# Patient Record
Sex: Male | Born: 2005 | Race: White | Hispanic: No | Marital: Single | Smoking: Never smoker
Health system: Southern US, Community
[De-identification: ages and names within clinical notes are randomized; demographics above are authoritative.]

## PROBLEM LIST (undated history)

## (undated) DIAGNOSIS — J45909 Unspecified asthma, uncomplicated: Secondary | ICD-10-CM

## (undated) DIAGNOSIS — T7840XA Allergy, unspecified, initial encounter: Secondary | ICD-10-CM

## (undated) HISTORY — DX: Allergy, unspecified, initial encounter: T78.40XA

## (undated) HISTORY — PX: WRIST FRACTURE SURGERY: SHX121

## (undated) HISTORY — DX: Unspecified asthma, uncomplicated: J45.909

---
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
## 2023-02-13 ENCOUNTER — Telehealth: Payer: Self-pay

## 2023-02-13 NOTE — Telephone Encounter (Signed)
Pt's mom called in stating that pt was seen in e/r for a cut that he received 5 stitches for. Pt's mom wants to know if pt should get a Tetanus booster shot? Please advise.  Cb#: [phone]

## 2023-02-22 ENCOUNTER — Encounter: Payer: Self-pay | Admitting: Family Medicine

## 2023-02-22 ENCOUNTER — Ambulatory Visit (INDEPENDENT_AMBULATORY_CARE_PROVIDER_SITE_OTHER): Payer: 59 | Admitting: Family Medicine

## 2023-02-22 VITALS — BP 120/72 | HR 71 | Temp 98.3°F | Ht 72.7 in | Wt 196.2 lb

## 2023-02-22 DIAGNOSIS — J45909 Unspecified asthma, uncomplicated: Secondary | ICD-10-CM | POA: Insufficient documentation

## 2023-02-22 DIAGNOSIS — Z4802 Encounter for removal of sutures: Secondary | ICD-10-CM | POA: Diagnosis not present

## 2023-02-22 NOTE — Assessment & Plan Note (Signed)
Symptoms consistent with allergic rhinitis. Encouraged Flonase PRN and daily Cetrizine. Return to office for fever, mucopurulent drainage or sinus pressure and pain.

## 2023-02-22 NOTE — Assessment & Plan Note (Signed)
Laceration well approximated and healing nicely. No redness, warmth, or drainage. 11 days s/p injury. Sutures removed without complications. Return to office for redness, warmth, or drainage.

## 2023-02-22 NOTE — Progress Notes (Signed)
Subjective:  HPI: Cameron Horton is a 17 y.o. male presenting on 02/22/2023 for Sinus Problem (Pt c/o nasal congestion and pressure. ) and Wound Check (Would like stitches removed from L hand. )   HPI Patient is in today for suture removal. He has a cut on his left hand that was repaired 11 days ago. It is healing well with no drainage or redness.  He also reports a month of nasal congestion. No purulent drainage, fever, chills, body aches, sinus pressure.   Suture Removal  Date/Time: 02/22/2023 1:06 PM  Performed by: Park Meo, FNP Authorized by: Kurtis Bushman S, FNP  Body area: upper extremity Location details: left hand Wound Appearance: clean Post-removal: antibiotic ointment applied Patient tolerance: patient tolerated the procedure well with no immediate complications      Review of Systems  All other systems reviewed and are negative.   Relevant past medical history reviewed and updated as indicated.   Past Medical History:  Diagnosis Date   Allergy    seasonal   Asthma      Past Surgical History:  Procedure Laterality Date   WRIST FRACTURE SURGERY     cast (LEFT)    Allergies and medications reviewed and updated.   Current Outpatient Medications:    albuterol (VENTOLIN HFA) 108 (90 Base) MCG/ACT inhaler, Inhale 2 puffs into the lungs every 6 (six) hours as needed for wheezing or shortness of breath., Disp: 8 g, Rfl: 0   cephALEXin (KEFLEX) 500 MG capsule, Take 500 mg by mouth every 6 (six) hours., Disp: , Rfl:    EPIPEN 2-PAK 0.3 MG/0.3ML SOAJ injection, Inject INTRAMUSCULARLY AS NEEDED for anaphylaxis., Disp: 2 each, Rfl: 0   fluticasone (FLOVENT HFA) 110 MCG/ACT inhaler, Inhale 2 puffs into the lungs 2 (two) times daily., Disp: 1 Inhaler, Rfl: 12   predniSONE (DELTASONE) 20 MG tablet, 3 tabs poqday 1-2, 2 tabs poqday 3-4, 1 tab poqday 5-6, Disp: 12 tablet, Rfl: 0  Allergies  Allergen Reactions   Flounder [Fish Allergy] Hives, Shortness Of  Breath and Swelling   Other Hives, Shortness Of Breath and Swelling    SALT-WATER FISH ALLERGY   Shellfish Allergy Swelling, Hives, Rash, Itching and Shortness Of Breath   Citrullus Vulgaris Hives   Penicillin G Hives and Swelling   Penicillins Rash, Hives and Swelling    Objective:   BP 120/72   Pulse 71   Temp 98.3 F (36.8 C)   Ht 6' 0.7" (1.847 m)   Wt 196 lb 3.2 oz (89 kg)   SpO2 99%   BMI 26.10 kg/m      02/22/2023   11:51 AM 08/01/2022    2:32 PM 05/12/2022    9:57 AM  Vitals with BMI  Height 6' 0.7" 6\' 0"    Weight 196 lbs 3 oz 193 lbs   BMI 26.09 26.17   Systolic 120 120 161  Diastolic 72 68 78  Pulse 71 62 54     Physical Exam Vitals and nursing note reviewed.  Constitutional:      Appearance: Normal appearance. He is normal weight.  HENT:     Head: Normocephalic and atraumatic.     Nose: Congestion present.  Eyes:     Conjunctiva/sclera: Conjunctivae normal.  Skin:    General: Skin is warm and dry.     Capillary Refill: Capillary refill takes less than 2 seconds.     Findings: Laceration present.       Neurological:  General: No focal deficit present.     Mental Status: He is alert and oriented to person, place, and time. Mental status is at baseline.  Psychiatric:        Mood and Affect: Mood normal.        Behavior: Behavior normal.        Thought Content: Thought content normal.        Judgment: Judgment normal.     Assessment & Plan:  Encounter for removal of sutures Assessment & Plan: Laceration well approximated and healing nicely. No redness, warmth, or drainage. 11 days s/p injury. Sutures removed without complications. Return to office for redness, warmth, or drainage.   Asthma due to seasonal allergies Assessment & Plan: Symptoms consistent with allergic rhinitis. Encouraged Flonase PRN and daily Cetrizine. Return to office for fever, mucopurulent drainage or sinus pressure and pain.   Other orders -     Suture Removal      Follow up plan: Return if symptoms worsen or fail to improve.  Park Meo, FNP

## 2023-03-21 ENCOUNTER — Other Ambulatory Visit: Payer: 59

## 2023-03-21 DIAGNOSIS — Z0000 Encounter for general adult medical examination without abnormal findings: Secondary | ICD-10-CM

## 2023-03-21 NOTE — Addendum Note (Signed)
Addended by: Arta Silence on: 03/21/2023 04:11 PM   Modules accepted: Orders

## 2023-03-22 LAB — LIPID PANEL
Cholesterol: 191 mg/dL — ABNORMAL HIGH (ref <170)
HDL: 45 mg/dL — ABNORMAL LOW (ref >45)
LDL Cholesterol (Calc): 130 mg/dL (calc) — ABNORMAL HIGH (ref <110)
Non-HDL Cholesterol (Calc): 146 mg/dL (calc) — ABNORMAL HIGH (ref <120)
Total CHOL/HDL Ratio: 4.2 (calc) (ref <5.0)
Triglycerides: 65 mg/dL (ref <90)

## 2023-03-22 LAB — COMPLETE METABOLIC PANEL WITH GFR
AG Ratio: 1.9 (calc) (ref 1.0–2.5)
ALT: 18 U/L (ref 8–46)
AST: 19 U/L (ref 12–32)
Albumin: 4.7 g/dL (ref 3.6–5.1)
Alkaline phosphatase (APISO): 44 U/L — ABNORMAL LOW (ref 46–169)
BUN: 15 mg/dL (ref 7–20)
CO2: 28 mmol/L (ref 20–32)
Calcium: 9.7 mg/dL (ref 8.9–10.4)
Chloride: 104 mmol/L (ref 98–110)
Creat: 1.12 mg/dL (ref 0.60–1.24)
Globulin: 2.5 g/dL (calc) (ref 2.1–3.5)
Glucose, Bld: 84 mg/dL (ref 65–99)
Potassium: 4.3 mmol/L (ref 3.8–5.1)
Sodium: 139 mmol/L (ref 135–146)
Total Bilirubin: 0.8 mg/dL (ref 0.2–1.1)
Total Protein: 7.2 g/dL (ref 6.3–8.2)
eGFR: 97 mL/min/{1.73_m2} (ref >=60)

## 2023-03-22 LAB — CBC WITH DIFFERENTIAL/PLATELET
Absolute Monocytes: 360 cells/uL (ref 200–950)
Basophils Absolute: 82 cells/uL (ref 0–200)
Basophils Relative: 1.7 %
Eosinophils Absolute: 470 cells/uL (ref 15–500)
Eosinophils Relative: 9.8 %
HCT: 43.7 % (ref 38.5–50.0)
Hemoglobin: 14.5 g/dL (ref 13.2–17.1)
Lymphs Abs: 1656 cells/uL (ref 850–3900)
MCH: 29.8 pg (ref 27.0–33.0)
MCHC: 33.2 g/dL (ref 32.0–36.0)
MCV: 89.7 fL (ref 80.0–100.0)
MPV: 9.9 fL (ref 7.5–12.5)
Monocytes Relative: 7.5 %
Neutro Abs: 2232 cells/uL (ref 1500–7800)
Neutrophils Relative %: 46.5 %
Platelets: 221 10*3/uL (ref 140–400)
RBC: 4.87 10*6/uL (ref 4.20–5.80)
RDW: 12.2 % (ref 11.0–15.0)
Total Lymphocyte: 34.5 %
WBC: 4.8 10*3/uL (ref 3.8–10.8)

## 2023-03-26 ENCOUNTER — Encounter: Payer: 59 | Admitting: Family Medicine

## 2023-04-05 ENCOUNTER — Ambulatory Visit (INDEPENDENT_AMBULATORY_CARE_PROVIDER_SITE_OTHER): Payer: 59 | Admitting: Family Medicine

## 2023-04-05 ENCOUNTER — Encounter: Payer: Self-pay | Admitting: Family Medicine

## 2023-04-05 VITALS — BP 118/64 | HR 77 | Temp 98.2°F | Ht 72.71 in | Wt 193.0 lb

## 2023-04-05 DIAGNOSIS — Z0000 Encounter for general adult medical examination without abnormal findings: Secondary | ICD-10-CM

## 2023-04-05 NOTE — Progress Notes (Signed)
Subjective:    Patient ID: Cameron Horton, male    DOB: [birth year], 17 y.o.   MRN: [id number]  HPI Patient is an 17 year old Caucasian gentleman here today for a physical exam.  Patient did well in college last year.  He made the Dean's list.  He denies any tobacco use.  He denies any vaping.  Recommended against any drug use.  He is training to become an RA.  He is dating.  He is in a stable monogamous relationship.  Overall he is doing well.  He denies any anxiety or depression.  He exercises regularly and is at a healthy weight.  His most recent lab work is listed below.  He admits to eating a lot of fried chicken and drinking a lot of sodas. Lab on 03/21/2023  Component Date Value Ref Range Status   Cholesterol 03/21/2023 191 (H)  <170 mg/dL Final   HDL 73/22/0254 45 (L)  >45 mg/dL Final   Triglycerides 27/02/2375 65  <90 mg/dL Final   LDL Cholesterol (Calc) 03/21/2023 130 (H)  <110 mg/dL (calc) Final   Comment: LDL-C is now calculated using the Martin-Hopkins  calculation, which is a validated novel method providing  better accuracy than the Friedewald equation in the  estimation of LDL-C.  Horald Pollen et al. Lenox Ahr. 2831;517(61): 2061-2068  (http://education.[facility].com/faq/FAQ164)    Total CHOL/HDL Ratio 03/21/2023 4.2  <6.0 (calc) Final   Non-HDL Cholesterol (Calc) 03/21/2023 146 (H)  <120 mg/dL (calc) Final   Comment: For patients with diabetes plus 1 major ASCVD risk  factor, treating to a non-HDL-C goal of <100 mg/dL  (LDL-C of <73 mg/dL) is considered a therapeutic  option.    Glucose, Bld 03/21/2023 84  65 - 99 mg/dL Final   Comment: .            Fasting reference interval .    BUN 03/21/2023 15  7 - 20 mg/dL Final   Creat 71/02/2693 1.12  0.60 - 1.24 mg/dL Final   eGFR 85/46/2703 97  > OR = 60 mL/min/1.26m2 Final   BUN/Creatinine Ratio 03/21/2023 SEE NOTE:  6 - 22 (calc) Final   Comment:    Not Reported: BUN and Creatinine are within    reference range. .     Sodium 03/21/2023 139  135 - 146 mmol/L Final   Potassium 03/21/2023 4.3  3.8 - 5.1 mmol/L Final   Chloride 03/21/2023 104  98 - 110 mmol/L Final   CO2 03/21/2023 28  20 - 32 mmol/L Final   Calcium 03/21/2023 9.7  8.9 - 10.4 mg/dL Final   Total Protein 50/05/3817 7.2  6.3 - 8.2 g/dL Final   Albumin 29/93/7169 4.7  3.6 - 5.1 g/dL Final   Globulin 67/89/3810 2.5  2.1 - 3.5 g/dL (calc) Final   AG Ratio 03/21/2023 1.9  1.0 - 2.5 (calc) Final   Total Bilirubin 03/21/2023 0.8  0.2 - 1.1 mg/dL Final   Alkaline phosphatase (APISO) 03/21/2023 44 (L)  46 - 169 U/L Final   AST 03/21/2023 19  12 - 32 U/L Final   ALT 03/21/2023 18  8 - 46 U/L Final   WBC 03/21/2023 4.8  3.8 - 10.8 Thousand/uL Final   RBC 03/21/2023 4.87  4.20 - 5.80 Million/uL Final   Hemoglobin 03/21/2023 14.5  13.2 - 17.1 g/dL Final   HCT 17/51/0258 43.7  38.5 - 50.0 % Final   MCV 03/21/2023 89.7  80.0 - 100.0 fL Final   MCH 03/21/2023 29.8  27.0 -  33.0 pg Final   MCHC 03/21/2023 33.2  32.0 - 36.0 g/dL Final   RDW 08/65/7846 12.2  11.0 - 15.0 % Final   Platelets 03/21/2023 221  140 - 400 Thousand/uL Final   MPV 03/21/2023 9.9  7.5 - 12.5 fL Final   Neutro Abs 03/21/2023 2,232  1,500 - 7,800 cells/uL Final   Lymphs Abs 03/21/2023 1,656  850 - 3,900 cells/uL Final   Absolute Monocytes 03/21/2023 360  200 - 950 cells/uL Final   Eosinophils Absolute 03/21/2023 470  15 - 500 cells/uL Final   Basophils Absolute 03/21/2023 82  0 - 200 cells/uL Final   Neutrophils Relative % 03/21/2023 46.5  % Final   Total Lymphocyte 03/21/2023 34.5  % Final   Monocytes Relative 03/21/2023 7.5  % Final   Eosinophils Relative 03/21/2023 9.8  % Final   Basophils Relative 03/21/2023 1.7  % Final    Past Medical History:  Diagnosis Date   Allergy    seasonal   Asthma    Past Surgical History:  Procedure Laterality Date   WRIST FRACTURE SURGERY     cast (LEFT)   Current Outpatient Medications on File Prior to Visit  Medication Sig Dispense  Refill   albuterol (VENTOLIN HFA) 108 (90 Base) MCG/ACT inhaler Inhale 2 puffs into the lungs every 6 (six) hours as needed for wheezing or shortness of breath. 8 g 0   cephALEXin (KEFLEX) 500 MG capsule Take 500 mg by mouth every 6 (six) hours.     EPIPEN 2-PAK 0.3 MG/0.3ML SOAJ injection Inject INTRAMUSCULARLY AS NEEDED for anaphylaxis. 2 each 0   fluticasone (FLOVENT HFA) 110 MCG/ACT inhaler Inhale 2 puffs into the lungs 2 (two) times daily. 1 Inhaler 12   No current facility-administered medications on file prior to visit.   Allergies  Allergen Reactions   Flounder [Fish Allergy] Hives, Shortness Of Breath and Swelling   Other Hives, Shortness Of Breath and Swelling    SALT-WATER FISH ALLERGY   Shellfish Allergy Swelling, Hives, Rash, Itching and Shortness Of Breath   Citrullus Vulgaris Hives   Penicillin G Hives and Swelling   Penicillins Rash, Hives and Swelling   Social History   Socioeconomic History   Marital status: Single    Spouse name: Not on file   Number of children: Not on file   Years of education: Not on file   Highest education level: Not on file  Occupational History   Not on file  Tobacco Use   Smoking status: Never   Smokeless tobacco: Never  Substance and Sexual Activity   Alcohol use: No   Drug use: No   Sexual activity: Never    Comment: 5th grade [employer]  Other Topics Concern   Not on file  Social History Narrative   Not on file   Social Determinants of Health   Financial Resource Strain: Not on file  Food Insecurity: Not on file  Transportation Needs: Not on file  Physical Activity: Not on file  Stress: Not on file  Social Connections: Not on file  Intimate Partner Violence: Not on file   Family History  Problem Relation Age of Onset   Asthma Mother    Asthma Brother    Hyperlipidemia Maternal Grandmother    Hypertension Maternal Grandmother    COPD Maternal Grandfather    Diabetes Maternal Grandfather    Stroke Paternal  Grandfather    Hypertension Father      Review of Systems     Objective:  Physical Exam Vitals reviewed.  Constitutional:      General: He is not in acute distress.    Appearance: Normal appearance. He is normal weight. He is not ill-appearing, toxic-appearing or diaphoretic.  HENT:     Head: Normocephalic and atraumatic.     Right Ear: Tympanic membrane and ear canal normal. There is no impacted cerumen.     Left Ear: Tympanic membrane and ear canal normal. There is no impacted cerumen.     Nose: Nose normal. No congestion or rhinorrhea.     Mouth/Throat:     Mouth: Mucous membranes are moist.     Pharynx: Oropharynx is clear. No oropharyngeal exudate.  Eyes:     General: No scleral icterus.       Right eye: No discharge.        Left eye: No discharge.     Extraocular Movements: Extraocular movements intact.     Conjunctiva/sclera: Conjunctivae normal.     Pupils: Pupils are equal, round, and reactive to light.  Neck:     Vascular: No carotid bruit.  Cardiovascular:     Rate and Rhythm: Normal rate and regular rhythm.     Pulses: Normal pulses.     Heart sounds: Normal heart sounds. No murmur heard.    No friction rub. No gallop.  Pulmonary:     Effort: Pulmonary effort is normal. No respiratory distress.     Breath sounds: Normal breath sounds. No stridor. No wheezing, rhonchi or rales.  Chest:     Chest wall: No tenderness.  Abdominal:     General: Abdomen is flat. Bowel sounds are normal. There is no distension.     Palpations: Abdomen is soft. There is no mass.     Tenderness: There is no abdominal tenderness. There is no right CVA tenderness, left CVA tenderness, guarding or rebound.     Hernia: No hernia is present.  Musculoskeletal:        General: No swelling, tenderness, deformity or signs of injury. Normal range of motion.     Cervical back: Normal range of motion and neck supple. No rigidity or tenderness.     Right lower leg: No edema.     Left lower  leg: No edema.  Lymphadenopathy:     Cervical: No cervical adenopathy.  Skin:    General: Skin is warm.     Coloration: Skin is not jaundiced or pale.     Findings: No bruising, erythema, lesion or rash.  Neurological:     General: No focal deficit present.     Mental Status: He is alert and oriented to person, place, and time. Mental status is at baseline.     Cranial Nerves: No cranial nerve deficit.     Sensory: No sensory deficit.     Motor: No weakness.     Coordination: Coordination normal.     Gait: Gait normal.     Deep Tendon Reflexes: Reflexes normal.  Psychiatric:        Mood and Affect: Mood normal.        Behavior: Behavior normal.        Thought Content: Thought content normal.        Judgment: Judgment normal.           Assessment & Plan:  General medical exam  Physical exam today is completely normal.  Immunizations are up-to-date.  Patient has an athletic build and is in good physical shape.  Recommended against any drug use.  Recommended  against vaping and tobacco use.  Patient is in a stable monogamous relationship.  Advised safe sex practices to avoid unintended pregnancies.  Lab work shows an elevated cholesterol.  Recommended a diet rich in fruits and vegetables.  Recommended reduction in sodas and fried foods.  Recommended more grilled and baked chicken and fish and [place] instead of beef and pork

## 2023-10-16 ENCOUNTER — Encounter: Payer: Self-pay | Admitting: Family Medicine

## 2023-10-16 ENCOUNTER — Ambulatory Visit (INDEPENDENT_AMBULATORY_CARE_PROVIDER_SITE_OTHER): Payer: 59 | Admitting: Family Medicine

## 2023-10-16 VITALS — BP 110/60 | HR 104 | Temp 99.4°F | Resp 97 | Ht 72.76 in | Wt 191.6 lb

## 2023-10-16 DIAGNOSIS — J029 Acute pharyngitis, unspecified: Secondary | ICD-10-CM | POA: Diagnosis not present

## 2023-10-16 DIAGNOSIS — J069 Acute upper respiratory infection, unspecified: Secondary | ICD-10-CM | POA: Diagnosis not present

## 2023-10-16 LAB — INFLUENZA A AND B AG, IMMUNOASSAY
INFLUENZA A ANTIGEN: DETECTED — AB
INFLUENZA B ANTIGEN: NOT DETECTED

## 2023-10-16 MED ORDER — OSELTAMIVIR PHOSPHATE 75 MG PO CAPS: 75.0000 mg | ORAL_CAPSULE | Freq: Two times a day (BID) | ORAL | 0 refills | Status: AC

## 2023-10-16 NOTE — Progress Notes (Signed)
Subjective:    Patient ID: Cameron Horton, male    DOB: [birth year], 18 y.o.   MRN: [id number] Patient is a 18 year old Caucasian gentleman who reports a 3 to 4-day history of sore scratchy throat, cough, body aches, and low-grade fever.  He denies any chest pain.  Denies shortness breath.  He denies any dyspnea on exertion.  He denies any nausea or vomiting or abdominal pain.  Strep test today is negative. Past Medical History:  Diagnosis Date   Allergy    seasonal   Asthma    Past Surgical History:  Procedure Laterality Date   WRIST FRACTURE SURGERY     cast (LEFT)   Current Outpatient Medications on File Prior to Visit  Medication Sig Dispense Refill   albuterol (VENTOLIN HFA) 108 (90 Base) MCG/ACT inhaler Inhale 2 puffs into the lungs every 6 (six) hours as needed for wheezing or shortness of breath. 8 g 0   EPIPEN 2-PAK 0.3 MG/0.3ML SOAJ injection Inject INTRAMUSCULARLY AS NEEDED for anaphylaxis. 2 each 0   fluticasone (FLOVENT HFA) 110 MCG/ACT inhaler Inhale 2 puffs into the lungs 2 (two) times daily. 1 Inhaler 12   No current facility-administered medications on file prior to visit.   Allergies  Allergen Reactions   Flounder [Fish Allergy] Hives, Shortness Of Breath and Swelling   Other Hives, Shortness Of Breath and Swelling    SALT-WATER FISH ALLERGY   Shellfish Allergy Swelling, Hives, Rash, Itching and Shortness Of Breath   Citrullus Vulgaris Hives   Penicillin G Hives and Swelling   Penicillins Rash, Hives and Swelling   Social History   Socioeconomic History   Marital status: Single    Spouse name: Not on file   Number of children: Not on file   Years of education: Not on file   Highest education level: Not on file  Occupational History   Not on file  Tobacco Use   Smoking status: Never   Smokeless tobacco: Never  Substance and Sexual Activity   Alcohol use: No   Drug use: No   Sexual activity: Never    Comment: 5th grade [employer]  Other Topics  Concern   Not on file  Social History Narrative   Not on file   Social Drivers of Health   Financial Resource Strain: Not on file  Food Insecurity: Not on file  Transportation Needs: Not on file  Physical Activity: Not on file  Stress: Not on file  Social Connections: Not on file  Intimate Partner Violence: Not on file     Review of Systems  All other systems reviewed and are negative.      Objective:   Physical Exam Vitals reviewed.  Constitutional:      Appearance: He is well-developed. He is not ill-appearing or toxic-appearing.  HENT:     Right Ear: Tympanic membrane and ear canal normal. Tympanic membrane is not erythematous.     Left Ear: Tympanic membrane and ear canal normal. Tympanic membrane is not erythematous.     Nose: No congestion or rhinorrhea.     Mouth/Throat:     Pharynx: Oropharynx is clear. No oropharyngeal exudate.  Eyes:     Conjunctiva/sclera: Conjunctivae normal.  Cardiovascular:     Rate and Rhythm: Normal rate and regular rhythm.     Heart sounds: Normal heart sounds. No murmur heard. Pulmonary:     Effort: Pulmonary effort is normal. No respiratory distress.     Breath sounds: Normal breath sounds. No stridor,  decreased air movement or transmitted upper airway sounds. No wheezing, rhonchi or rales.  Chest:     Chest wall: No tenderness.  Abdominal:     General: Bowel sounds are normal.     Palpations: Abdomen is soft. There is no mass.  Lymphadenopathy:     Cervical: No cervical adenopathy.  Neurological:     Mental Status: He is alert.           Assessment & Plan:  Sorethroat - Plan: STREP GROUP A AG, W/REFLEX TO CULT  Viral upper respiratory tract infection Strep test is negative, I believe the patient has a viral upper respiratory infection.  Symptoms sound similar to the flu.  Therefore I will check the patient for influenza.  Recommend supportive care including Tylenol and/or Profen for fever body aches, rest, pushing  fluids.  Anticipate gradual improvement over the next 3 to 4 days.  Patient tested positive for influenza.  Take Tamiflu 75 mg twice daily for 5 days.

## 2023-10-16 NOTE — Addendum Note (Signed)
Addended by: Darral Dash on: 10/16/2023 03:53 PM   Modules accepted: Orders

## 2023-10-18 LAB — CULTURE, GROUP A STREP
Micro Number: 16007547
SPECIMEN QUALITY:: ADEQUATE

## 2023-10-18 LAB — STREP GROUP A AG, W/REFLEX TO CULT: Streptococcus Group A AG: NOT DETECTED

## 2024-04-01 ENCOUNTER — Telehealth: Payer: Self-pay

## 2024-04-01 NOTE — Telephone Encounter (Signed)
 Copied from CRM [id number]. Topic: Appointments - Scheduling Inquiry for Clinic >> Apr 01, 2024  1:31 PM Fonda T wrote: Reason for CRM: Patient/patient representative is calling to schedule an appointment. Refer to attachments for appointment information.   Lab appointment scheduled for patient as per patient mom (Sallie-DPR verified) requested. Appointment details given, verbalized understanding.  Sending CRM to office to inform of lab appointment, scheduled on 04/04/24, as per [facility], okay to schedule without lab orders because orders will be placed when the patient arrives.   Thank you >> Apr 01, 2024  2:18 PM Delon B wrote: Routed to clinical pool.

## 2024-04-04 ENCOUNTER — Other Ambulatory Visit: Payer: Self-pay

## 2024-04-04 DIAGNOSIS — Z13220 Encounter for screening for lipoid disorders: Secondary | ICD-10-CM

## 2024-04-04 DIAGNOSIS — J45909 Unspecified asthma, uncomplicated: Secondary | ICD-10-CM

## 2024-04-04 DIAGNOSIS — Z0000 Encounter for general adult medical examination without abnormal findings: Secondary | ICD-10-CM | POA: Diagnosis not present

## 2024-04-05 LAB — COMPLETE METABOLIC PANEL WITHOUT GFR
AG Ratio: 2 (calc) (ref 1.0–2.5)
ALT: 16 U/L (ref 9–46)
AST: 25 U/L (ref 10–40)
Albumin: 4.3 g/dL (ref 3.6–5.1)
Alkaline phosphatase (APISO): 36 U/L (ref 36–130)
BUN: 12 mg/dL (ref 7–25)
CO2: 28 mmol/L (ref 20–32)
Calcium: 9.1 mg/dL (ref 8.6–10.3)
Chloride: 104 mmol/L (ref 98–110)
Creat: 1.19 mg/dL (ref 0.60–1.24)
Globulin: 2.1 g/dL (ref 1.9–3.7)
Glucose, Bld: 76 mg/dL (ref 65–99)
Potassium: 4 mmol/L (ref 3.5–5.3)
Sodium: 139 mmol/L (ref 135–146)
Total Bilirubin: 0.5 mg/dL (ref 0.2–1.2)
Total Protein: 6.4 g/dL (ref 6.1–8.1)

## 2024-04-05 LAB — LIPID PANEL
Cholesterol: 121 mg/dL (ref <200)
HDL: 40 mg/dL (ref >=40)
LDL Cholesterol (Calc): 72 mg/dL
Non-HDL Cholesterol (Calc): 81 mg/dL (ref <130)
Total CHOL/HDL Ratio: 3 (calc) (ref <5.0)
Triglycerides: 31 mg/dL (ref <150)

## 2024-04-05 LAB — CBC WITH DIFFERENTIAL/PLATELET
Absolute Lymphocytes: 1220 {cells}/uL (ref 850–3900)
Absolute Monocytes: 529 {cells}/uL (ref 200–950)
Basophils Absolute: 78 {cells}/uL (ref 0–200)
Basophils Relative: 1.6 %
Eosinophils Absolute: 686 {cells}/uL — ABNORMAL HIGH (ref 15–500)
Eosinophils Relative: 14 %
HCT: 42.6 % (ref 38.5–50.0)
Hemoglobin: 13.5 g/dL (ref 13.2–17.1)
MCH: 29.2 pg (ref 27.0–33.0)
MCHC: 31.7 g/dL — ABNORMAL LOW (ref 32.0–36.0)
MCV: 92.2 fL (ref 80.0–100.0)
MPV: 10.3 fL (ref 7.5–12.5)
Monocytes Relative: 10.8 %
Neutro Abs: 2386 {cells}/uL (ref 1500–7800)
Neutrophils Relative %: 48.7 %
Platelets: 172 Thousand/uL (ref 140–400)
RBC: 4.62 Million/uL (ref 4.20–5.80)
RDW: 11.8 % (ref 11.0–15.0)
Total Lymphocyte: 24.9 %
WBC: 4.9 Thousand/uL (ref 3.8–10.8)

## 2024-04-07 ENCOUNTER — Encounter: Payer: Self-pay | Admitting: Family Medicine

## 2024-04-07 ENCOUNTER — Ambulatory Visit: Payer: Self-pay | Admitting: Family Medicine

## 2024-04-07 ENCOUNTER — Ambulatory Visit (INDEPENDENT_AMBULATORY_CARE_PROVIDER_SITE_OTHER): Payer: Self-pay | Admitting: Family Medicine

## 2024-04-07 VITALS — BP 122/62 | HR 66 | Temp 98.5°F | Ht 72.76 in | Wt 193.4 lb

## 2024-04-07 DIAGNOSIS — Z0000 Encounter for general adult medical examination without abnormal findings: Secondary | ICD-10-CM

## 2024-04-07 NOTE — Progress Notes (Signed)
 Subjective:    Patient ID: Cameron Horton, male    DOB: July 17, 2006, 18 y.o.   MRN: [id number]  HPI Patient is a 18 year old Caucasian gentleman here today for a physical exam.  He will graduate in December with a degree in cyber security.  Overall he is doing well.  He denies any anxiety or depression.  He exercises regularly and is at a healthy weight.  His most recent lab work is listed below.  After last year, he made dramatic changes in his diet.  He dropped his cholesterol almost 70 points.  He dropped his bad cholesterol almost 60 points.  I am extremely impressed by this. Lab on 04/04/2024  Component Date Value Ref Range Status   WBC 04/04/2024 4.9  3.8 - 10.8 Thousand/uL Final   RBC 04/04/2024 4.62  4.20 - 5.80 Million/uL Final   Hemoglobin 04/04/2024 13.5  13.2 - 17.1 g/dL Final   HCT 92/81/7974 42.6  38.5 - 50.0 % Final   MCV 04/04/2024 92.2  80.0 - 100.0 fL Final   MCH 04/04/2024 29.2  27.0 - 33.0 pg Final   MCHC 04/04/2024 31.7 (L)  32.0 - 36.0 g/dL Final   Comment: For adults, a slight decrease in the calculated MCHC value (in the range of 30 to 32 g/dL) is most likely not clinically significant; however, it should be interpreted with caution in correlation with other red cell parameters and the patient's clinical condition.    RDW 04/04/2024 11.8  11.0 - 15.0 % Final   Platelets 04/04/2024 172  140 - 400 Thousand/uL Final   MPV 04/04/2024 10.3  7.5 - 12.5 fL Final   Neutro Abs 04/04/2024 2,386  1,500 - 7,800 cells/uL Final   Absolute Lymphocytes 04/04/2024 1,220  850 - 3,900 cells/uL Final   Absolute Monocytes 04/04/2024 529  200 - 950 cells/uL Final   Eosinophils Absolute 04/04/2024 686 (H)  15 - 500 cells/uL Final   Basophils Absolute 04/04/2024 78  0 - 200 cells/uL Final   Neutrophils Relative % 04/04/2024 48.7  % Final   Total Lymphocyte 04/04/2024 24.9  % Final   Monocytes Relative 04/04/2024 10.8  % Final   Eosinophils Relative 04/04/2024 14.0  % Final    Basophils Relative 04/04/2024 1.6  % Final   Glucose, Bld 04/04/2024 76  65 - 99 mg/dL Final   Comment: .            Fasting reference interval .    BUN 04/04/2024 12  7 - 25 mg/dL Final   Creat 92/81/7974 1.19  0.60 - 1.24 mg/dL Final   BUN/Creatinine Ratio 04/04/2024 SEE NOTE:  6 - 22 (calc) Final   Comment:    Not Reported: BUN and Creatinine are within    reference range. .    Sodium 04/04/2024 139  135 - 146 mmol/L Final   Potassium 04/04/2024 4.0  3.5 - 5.3 mmol/L Final   Chloride 04/04/2024 104  98 - 110 mmol/L Final   CO2 04/04/2024 28  20 - 32 mmol/L Final   Calcium 04/04/2024 9.1  8.6 - 10.3 mg/dL Final   Total Protein 92/81/7974 6.4  6.1 - 8.1 g/dL Final   Albumin 92/81/7974 4.3  3.6 - 5.1 g/dL Final   Globulin 92/81/7974 2.1  1.9 - 3.7 g/dL (calc) Final   AG Ratio 04/04/2024 2.0  1.0 - 2.5 (calc) Final   Total Bilirubin 04/04/2024 0.5  0.2 - 1.2 mg/dL Final   Alkaline phosphatase (APISO) 04/04/2024 36  36 - 130 U/L Final   AST 04/04/2024 25  10 - 40 U/L Final   ALT 04/04/2024 16  9 - 46 U/L Final   Cholesterol 04/04/2024 121  <200 mg/dL Final   HDL 92/81/7974 40  > OR = 40 mg/dL Final   Triglycerides 92/81/7974 31  <150 mg/dL Final   LDL Cholesterol (Calc) 04/04/2024 72  mg/dL (calc) Final   Comment: Reference range: <100 . Desirable range <100 mg/dL for primary prevention;   <70 mg/dL for patients with CHD or diabetic patients  with > or = 2 CHD risk factors. Cameron Horton is now calculated using the Martin-Hopkins  calculation, which is a validated novel method providing  better accuracy than the Friedewald equation in the  estimation of Horton.  Cameron Horton et al. Cameron Horton. 7986;689(80): 2061-2068  (http://education.[facility].com/faq/FAQ164)    Total CHOL/HDL Ratio 04/04/2024 3.0  <4.9 (calc) Final   Non-HDL Cholesterol (Calc) 04/04/2024 81  <130 mg/dL (calc) Final   Comment: For patients with diabetes plus 1 major ASCVD risk  factor, treating to a non-HDL-C  goal of <100 mg/dL  (Horton of <29 mg/dL) is considered a therapeutic  option.     Past Medical History:  Diagnosis Date   Allergy    seasonal   Asthma    Past Surgical History:  Procedure Laterality Date   WRIST FRACTURE SURGERY     cast (LEFT)   Current Outpatient Medications on File Prior to Visit  Medication Sig Dispense Refill   albuterol  (VENTOLIN  HFA) 108 (90 Base) MCG/ACT inhaler Inhale 2 puffs into the lungs every 6 (six) hours as needed for wheezing or shortness of breath. 8 g 0   EPIPEN 2-PAK 0.3 MG/0.3ML SOAJ injection Inject INTRAMUSCULARLY AS NEEDED for anaphylaxis. 2 each 0   fluticasone  (FLOVENT  HFA) 110 MCG/ACT inhaler Inhale 2 puffs into the lungs 2 (two) times daily. 1 Inhaler 12   oseltamivir  (TAMIFLU ) 75 MG capsule Take 1 capsule (75 mg total) by mouth 2 (two) times daily. (Patient not taking: Reported on 04/07/2024) 10 capsule 0   No current facility-administered medications on file prior to visit.   Allergies  Allergen Reactions   Flounder [Fish Allergy] Hives, Shortness Of Breath and Swelling   Other Hives, Shortness Of Breath and Swelling    SALT-WATER FISH ALLERGY   Shellfish Allergy Swelling, Hives, Rash, Itching and Shortness Of Breath   Citrullus Vulgaris Hives   Penicillin G Hives and Swelling   Penicillins Rash, Hives and Swelling   Social History   Socioeconomic History   Marital status: Single    Spouse name: Not on file   Number of children: Not on file   Years of education: Not on file   Highest education level: Not on file  Occupational History   Not on file  Tobacco Use   Smoking status: Never   Smokeless tobacco: Never  Substance and Sexual Activity   Alcohol use: No   Drug use: No   Sexual activity: Never    Comment: 5th grade [employer]  Other Topics Concern   Not on file  Social History Narrative   Not on file   Social Drivers of Health   Financial Resource Strain: Not on file  Food Insecurity: Not on file   Transportation Needs: Not on file  Physical Activity: Not on file  Stress: Not on file  Social Connections: Not on file  Intimate Partner Violence: Not on file   Family History  Problem Relation Age of Onset  Asthma Mother    Asthma Brother    Hyperlipidemia Maternal Grandmother    Hypertension Maternal Grandmother    COPD Maternal Grandfather    Diabetes Maternal Grandfather    Stroke Paternal Grandfather    Hypertension Father      Review of Systems     Objective:   Physical Exam Vitals reviewed.  Constitutional:      General: He is not in acute distress.    Appearance: Normal appearance. He is normal weight. He is not ill-appearing, toxic-appearing or diaphoretic.  HENT:     Head: Normocephalic and atraumatic.     Right Ear: Tympanic membrane and ear canal normal. There is no impacted cerumen.     Left Ear: Tympanic membrane and ear canal normal. There is no impacted cerumen.     Nose: Nose normal. No congestion or rhinorrhea.     Mouth/Throat:     Mouth: Mucous membranes are moist.     Pharynx: Oropharynx is clear. No oropharyngeal exudate.  Eyes:     General: No scleral icterus.       Right eye: No discharge.        Left eye: No discharge.     Extraocular Movements: Extraocular movements intact.     Conjunctiva/sclera: Conjunctivae normal.     Pupils: Pupils are equal, round, and reactive to light.  Neck:     Vascular: No carotid bruit.  Cardiovascular:     Rate and Rhythm: Normal rate and regular rhythm.     Pulses: Normal pulses.     Heart sounds: Normal heart sounds. No murmur heard.    No friction rub. No gallop.  Pulmonary:     Effort: Pulmonary effort is normal. No respiratory distress.     Breath sounds: Normal breath sounds. No stridor. No wheezing, rhonchi or rales.  Chest:     Chest wall: No tenderness.  Abdominal:     General: Abdomen is flat. Bowel sounds are normal. There is no distension.     Palpations: Abdomen is soft. There is no mass.      Tenderness: There is no abdominal tenderness. There is no right CVA tenderness, left CVA tenderness, guarding or rebound.     Hernia: No hernia is present.  Musculoskeletal:        General: No swelling, tenderness, deformity or signs of injury. Normal range of motion.     Cervical back: Normal range of motion and neck supple. No rigidity or tenderness.     Right lower leg: No edema.     Left lower leg: No edema.  Lymphadenopathy:     Cervical: No cervical adenopathy.  Skin:    General: Skin is warm.     Coloration: Skin is not jaundiced or pale.     Findings: No bruising, erythema, lesion or rash.  Neurological:     General: No focal deficit present.     Mental Status: He is alert and oriented to person, place, and time. Mental status is at baseline.     Cranial Nerves: No cranial nerve deficit.     Sensory: No sensory deficit.     Motor: No weakness.     Coordination: Coordination normal.     Gait: Gait normal.     Deep Tendon Reflexes: Reflexes normal.  Psychiatric:        Mood and Affect: Mood normal.        Behavior: Behavior normal.        Thought Content: Thought content normal.  Judgment: Judgment normal.           Assessment & Plan:  General medical exam Patient is a dramatic improvement in his cholesterol.  I congratulated him.  His cholesterol numbers now are outstanding.  He does have a mild elevation in his eosinophils.  He has a history of asthma and allergies.  However at the present time he is asymptomatic and therefore I do not feel that additional workup or treatment is necessary.  He is due for his tetanus shot in 2027.  Otherwise his preventative care is up-to-date.
